# Patient Record
Sex: Female | Born: 2005 | Race: White | Hispanic: No | Marital: Single | Smoking: Never smoker
Health system: Southern US, Community
[De-identification: ages and names within clinical notes are randomized; demographics above are authoritative.]

## PROBLEM LIST (undated history)

## (undated) DIAGNOSIS — K219 Gastro-esophageal reflux disease without esophagitis: Secondary | ICD-10-CM

## (undated) DIAGNOSIS — T7840XA Allergy, unspecified, initial encounter: Secondary | ICD-10-CM

## (undated) HISTORY — DX: Gastro-esophageal reflux disease without esophagitis: K21.9

## (undated) HISTORY — DX: Allergy, unspecified, initial encounter: T78.40XA

---
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
## 2023-09-13 ENCOUNTER — Encounter: Payer: Self-pay | Admitting: Radiology

## 2023-11-25 ENCOUNTER — Encounter: Payer: Self-pay | Admitting: Radiology
